# Patient Record
Sex: Female | Born: 1995 | Race: White | Hispanic: No | Marital: Single | Smoking: Never smoker
Health system: Southern US, Community
[De-identification: ages and names within clinical notes are randomized; demographics above are authoritative.]

## PROBLEM LIST (undated history)

## (undated) HISTORY — PX: ARTHROSCOPIC REPAIR ACL: SUR80

---
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
## 2017-02-13 ENCOUNTER — Ambulatory Visit (HOSPITAL_COMMUNITY)
Admission: EM | Admit: 2017-02-13 | Discharge: 2017-02-13 | Disposition: A | Discharge status: Home / Self Care | Payer: BLUE CROSS/BLUE SHIELD | Attending: Family Medicine | Admitting: Family Medicine

## 2017-02-13 ENCOUNTER — Ambulatory Visit (INDEPENDENT_AMBULATORY_CARE_PROVIDER_SITE_OTHER): Payer: BLUE CROSS/BLUE SHIELD

## 2017-02-13 ENCOUNTER — Encounter (HOSPITAL_COMMUNITY): Payer: Self-pay | Admitting: Emergency Medicine

## 2017-02-13 DIAGNOSIS — M25571 Pain in right ankle and joints of right foot: Secondary | ICD-10-CM

## 2017-02-13 NOTE — ED Triage Notes (Signed)
Yesterday jumped over a ditch and did not clear the other side.  Reports ankle rolled, heard/felt pop.  Swelling visible.  Able to move toes.  Right pedal pulses 2 plus.  There is a bruise above ankle -patient says this is an old bruise

## 2017-02-13 NOTE — Discharge Instructions (Signed)
Rest, ice, elevation and wear the mobilization device for at least 3 days and while having to be walking or prolonged standing. Sometimes he may have to wear it longer than 3 days depending on how your ankle feels. Use crutches for the first 2 or 3 days to limit weightbearing read instructions on ankle sprain and after your ankle is feeling a little better he may start in the rehabilitation phase I instructions.

## 2017-02-13 NOTE — ED Provider Notes (Signed)
CSN: [id number]     Arrival date & time 02/13/17  1002 History   First MD Initiated Contact with Patient 02/13/17 1051     Chief Complaint  Patient presents with  . Ankle Pain   (Consider location/radiation/quality/duration/timing/severity/associated sxs/prior Treatment) 21 year old female was running and attempted to jump a ditch however on landing she twisted her right ankle. She states she felt a pop in experience pain to the lateral aspect.      History reviewed. No pertinent past medical history. Past Surgical History:  Procedure Laterality Date  . ARTHROSCOPIC REPAIR ACL     No family history on file. Social History  Substance Use Topics  . Smoking status: Never Smoker  . Smokeless tobacco: Not on file  . Alcohol use No   OB History    No data available     Review of Systems  Constitutional: Negative.  Negative for activity change, chills and fever.  HENT: Negative.   Respiratory: Negative.   Musculoskeletal:       As per HPI  Skin: Negative for color change, pallor and rash.  Neurological: Negative.   All other systems reviewed and are negative.   Allergies  Patient has no known allergies.  Home Medications   Prior to Admission medications   Medication Sig Start Date End Date Taking? Authorizing Provider  OVER THE COUNTER MEDICATION Birth control pill   Yes Historical Provider, MD   Meds Ordered and Administered this Visit  Medications - No data to display  BP 123/64 (BP Location: Left Arm)   Pulse 67   Temp 98.9 F (37.2 C) (Oral)   Resp 18   LMP 01/22/2017 (Exact Date)   SpO2 100%  No data found.   Physical Exam  Constitutional: She is oriented to person, place, and time. She appears well-developed and well-nourished. No distress.  HENT:  Head: Normocephalic and atraumatic.  Eyes: EOM are normal.  Neck: Neck supple.  Cardiovascular: Normal rate.   Pulmonary/Chest: Effort normal.  Musculoskeletal: She exhibits edema and tenderness. She  exhibits no deformity.  Right ankle with swelling and pain and tenderness to the lateral aspect. No pain or tenderness to the foot or medial aspect of the ankle. Pedal pulse 1+. Limited range of motion of the ankle. Able to wiggle toes.  Lymphadenopathy:    She has no cervical adenopathy.  Neurological: She is alert and oriented to person, place, and time. No cranial nerve deficit.  Skin: Skin is warm and dry.  Psychiatric: She has a normal mood and affect.  Nursing note and vitals reviewed.   Urgent Care Course     Procedures (including critical care time)  Labs Review Labs Reviewed - No data to display  Imaging Review Dg Ankle Complete Right  Result Date: 02/13/2017 CLINICAL DATA:  Patient states yesterday evening, going for a run, went to jump over a ditch, right ankle cracked and rolled at the same time. Patient pointed to the right ankle, lateral malleolus, posterior to the lateral malleolus, posteriorly aspect of the Achilles when attempting to bear weight, to which the patient can't bear weight. Patient is unable to flex or extend the right foot now. No prior injury to the right ankle. EXAM: RIGHT ANKLE - COMPLETE 3+ VIEW COMPARISON:  None. FINDINGS: Three views of the right ankle submitted. No acute fracture or subluxation. Ankle mortise is preserved. Significant soft tissue swelling adjacent to lateral malleolus. There is well corticated bony fragment adjacent to the tip of distal fibula. This is  most likely due to prior injury measure about 6 mm. No definite donor site is identified. IMPRESSION: No acute fracture or subluxation. Ankle mortise is preserved. Significant soft tissue swelling adjacent to lateral malleolus. There is well corticated bony fragment adjacent to the tip of distal fibula. This is most likely due to prior injury measure about 6 mm. Electronically Signed   By: Natasha Mead M.D.   On: 02/13/2017 11:23     Visual Acuity Review  Right Eye Distance:   Left Eye  Distance:   Bilateral Distance:    Right Eye Near:   Left Eye Near:    Bilateral Near:         MDM   1. Acute right ankle pain    Rest, ice, elevation and wear the mobilization device for at least 3 days and while having to be walking or prolonged standing. Sometimes he may have to wear it longer than 3 days depending on how your ankle feels. Use crutches for the first 2 or 3 days to limit weightbearing read instructions on ankle sprain and after your ankle is feeling a little better he may start in the rehabilitation phase I instructions.     Hayden Rasmussen, NP 02/13/17 1149

## 2018-03-13 IMAGING — DX DG ANKLE COMPLETE 3+V*R*
3 series · 3 of 3 positions shown · non-contrast
Comparison: None.

CLINICAL DATA: Patient states yesterday evening, going for a run,
went to jump over a ditch, right ankle cracked and rolled at the
same time. Patient pointed to the right ankle, lateral malleolus,
posterior to the lateral malleolus, posteriorly aspect of the
Achilles when attempting to bear weight, to which the patient can't
bear weight. Patient is unable to flex or extend the right foot now.
No prior injury to the right ankle.

EXAM:
RIGHT ANKLE - COMPLETE 3+ VIEW

[ankle ap]
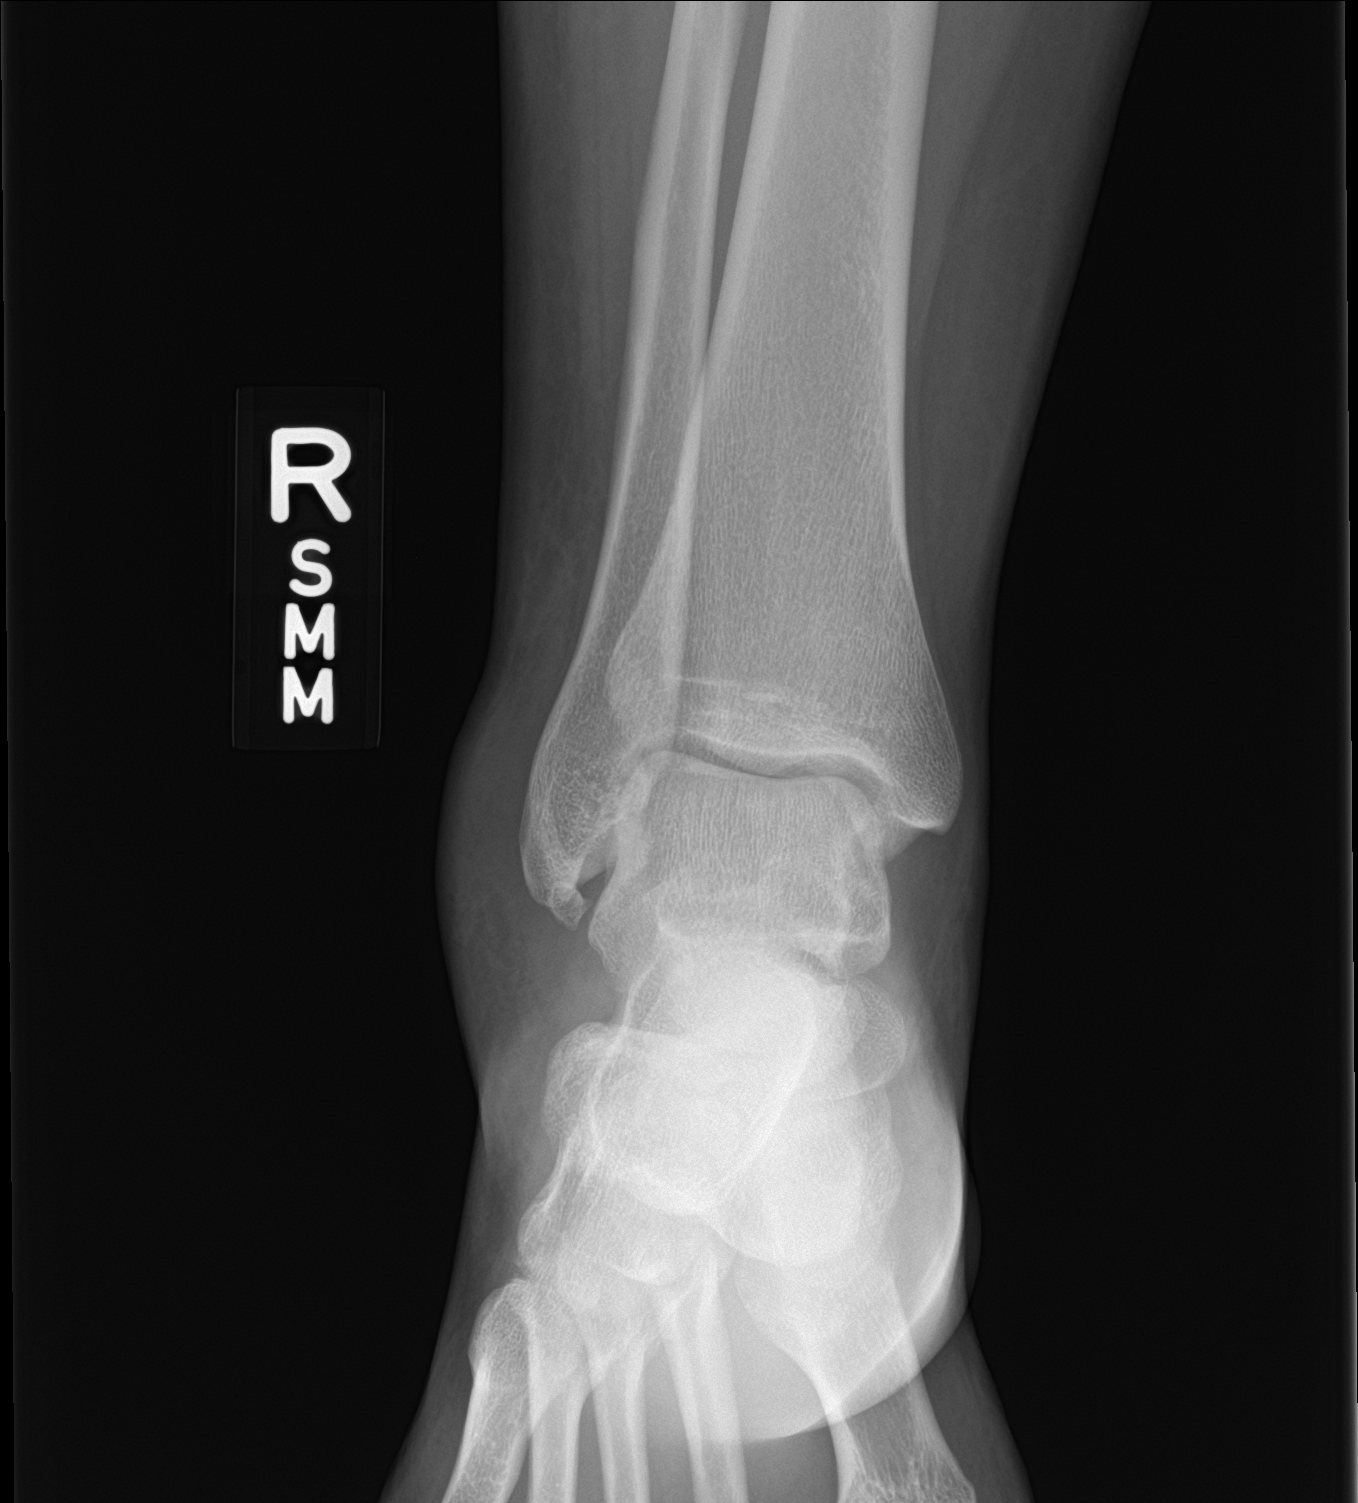

[ankle obl]
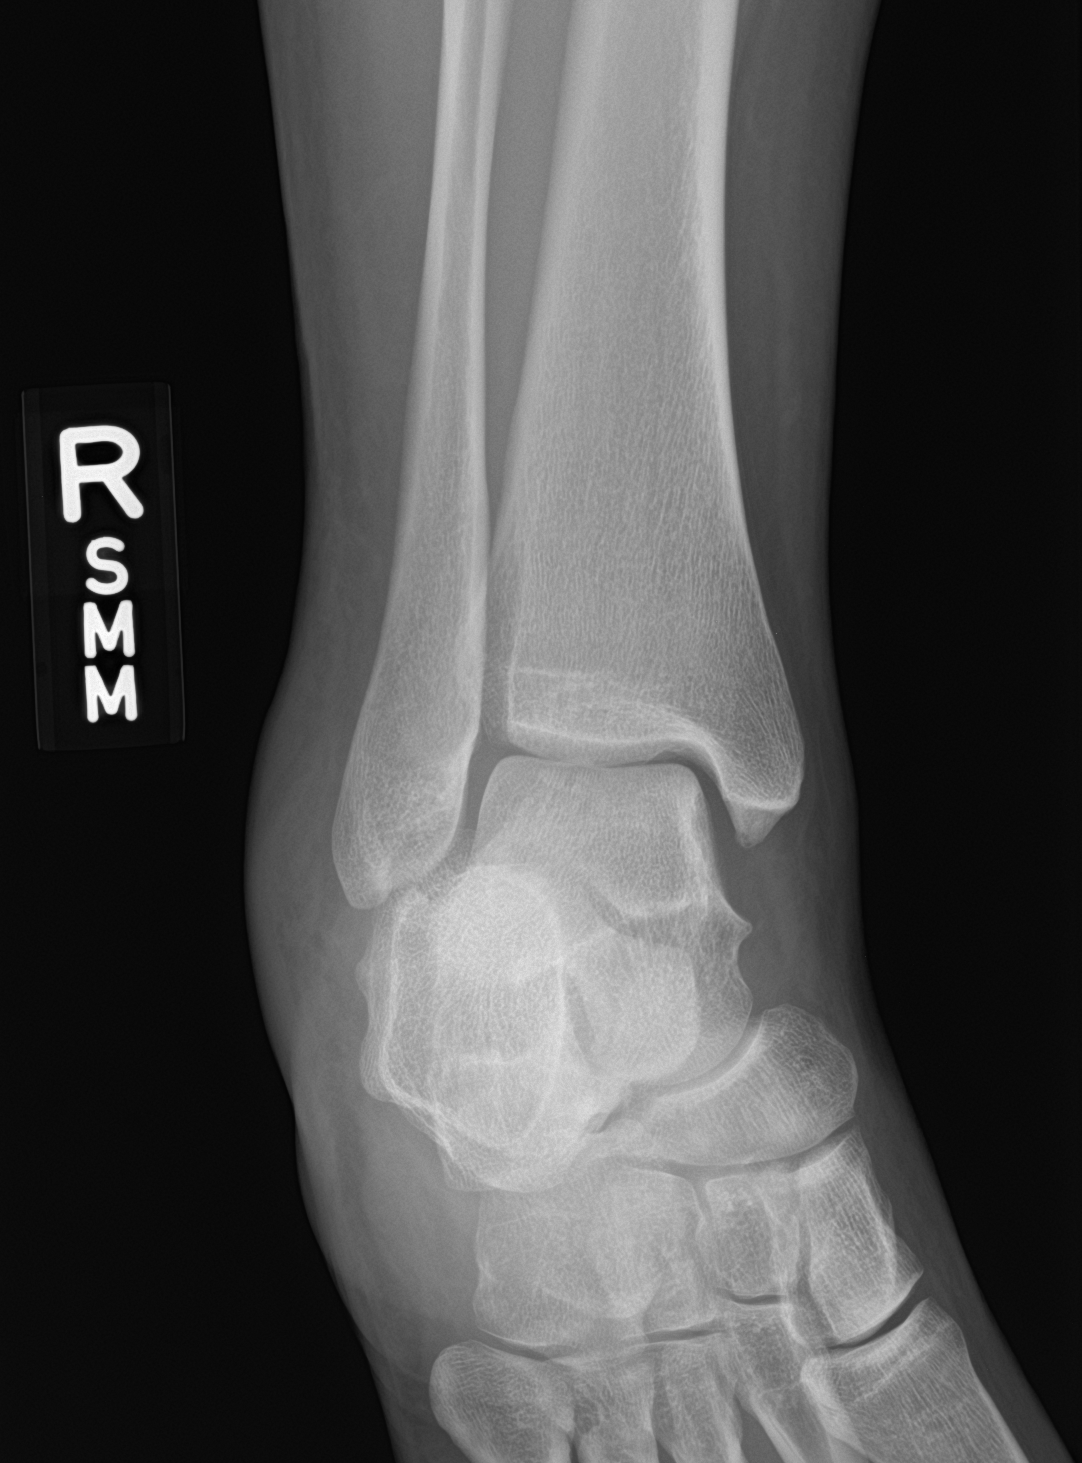

[ankle lat]
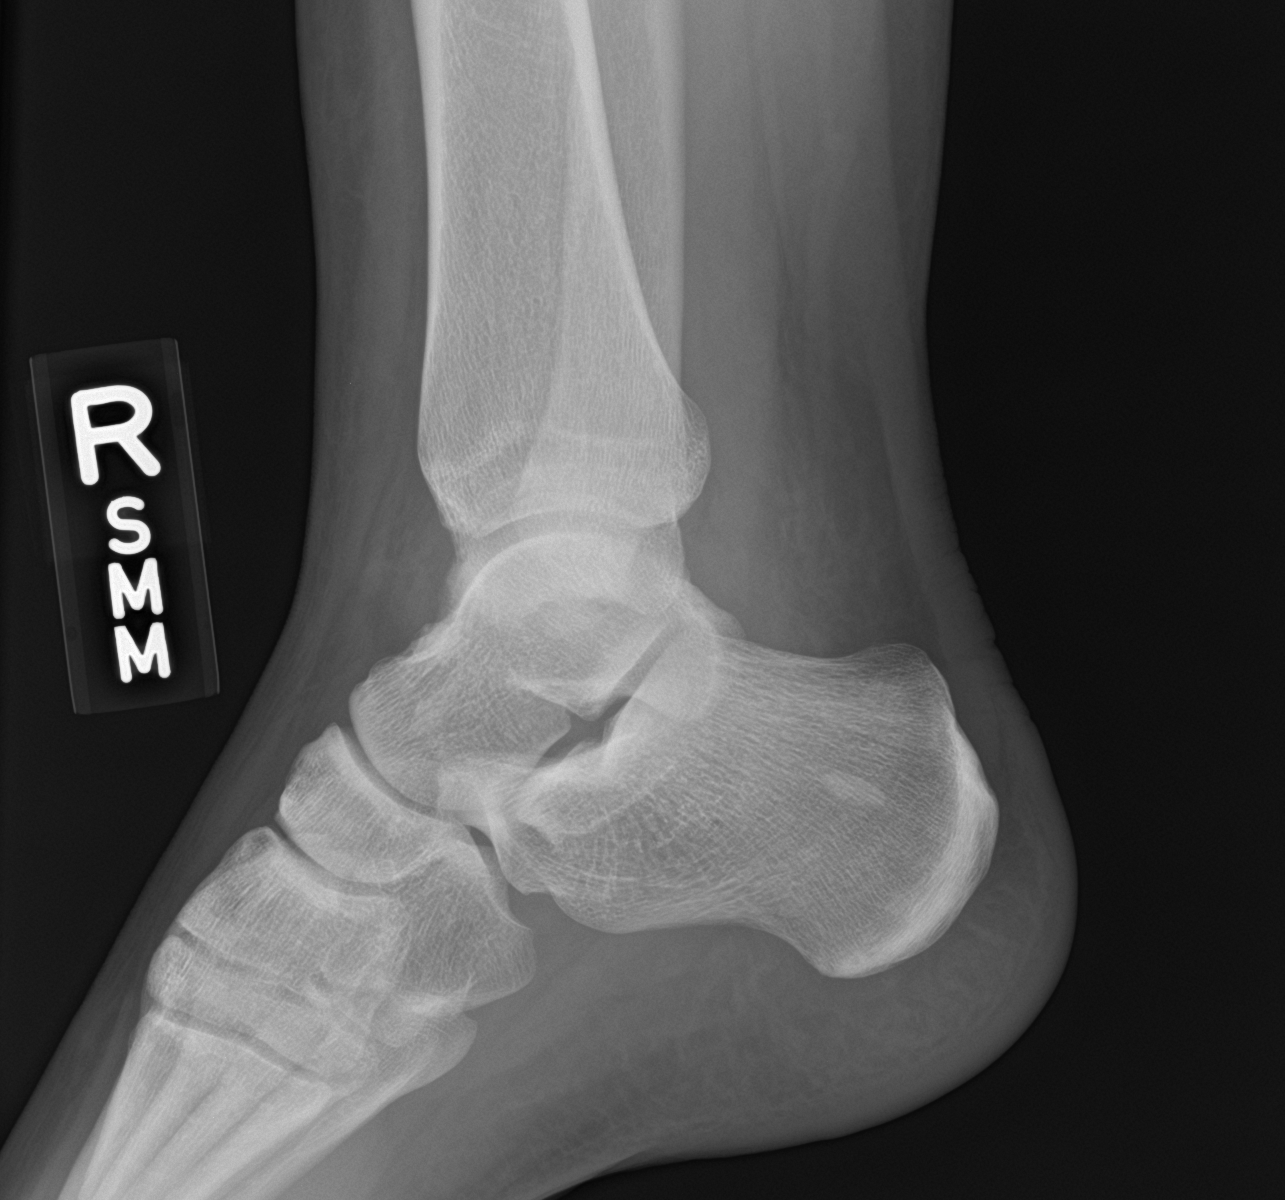

[3 of 3 positions shown; findings below may reference images not displayed]

FINDINGS: Three views of the right ankle submitted. No acute fracture or
subluxation. Ankle mortise is preserved. Significant soft tissue
swelling adjacent to lateral malleolus. There is well corticated
bony fragment adjacent to the tip of distal fibula. This is most
likely due to prior injury measure about 6 mm. No definite donor
site is identified.
IMPRESSION: No acute fracture or subluxation. Ankle mortise is preserved.
Significant soft tissue swelling adjacent to lateral malleolus.
There is well corticated bony fragment adjacent to the tip of distal
fibula. This is most likely due to prior injury measure about 6 mm.

## 2019-07-26 ENCOUNTER — Emergency Department (HOSPITAL_COMMUNITY)
Admission: EM | Admit: 2019-07-26 | Discharge: 2019-07-26 | Disposition: A | Discharge status: Home / Self Care | Payer: BLUE CROSS/BLUE SHIELD | Attending: Emergency Medicine | Admitting: Emergency Medicine

## 2019-07-26 ENCOUNTER — Other Ambulatory Visit: Payer: Self-pay

## 2019-07-26 ENCOUNTER — Encounter (HOSPITAL_COMMUNITY): Payer: Self-pay | Admitting: Emergency Medicine

## 2019-07-26 DIAGNOSIS — Z041 Encounter for examination and observation following transport accident: Secondary | ICD-10-CM | POA: Insufficient documentation

## 2019-07-26 DIAGNOSIS — M542 Cervicalgia: Secondary | ICD-10-CM | POA: Insufficient documentation

## 2019-07-26 DIAGNOSIS — M549 Dorsalgia, unspecified: Secondary | ICD-10-CM | POA: Insufficient documentation

## 2019-07-26 MED ORDER — CYCLOBENZAPRINE HCL 10 MG PO TABS: 5.0000 mg | ORAL_TABLET | Freq: Two times a day (BID) | ORAL | 0 refills | Status: AC | PRN

## 2019-07-26 MED ORDER — NAPROXEN 375 MG PO TABS: 375.0000 mg | ORAL_TABLET | Freq: Two times a day (BID) | ORAL | 0 refills | Status: AC
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
## 2019-07-26 NOTE — Discharge Instructions (Signed)
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
## 2024-09-14 ENCOUNTER — Other Ambulatory Visit: Payer: Self-pay

## 2024-09-14 ENCOUNTER — Emergency Department (HOSPITAL_BASED_OUTPATIENT_CLINIC_OR_DEPARTMENT_OTHER)
Admission: EM | Admit: 2024-09-14 | Discharge: 2024-09-14 | Disposition: A | Discharge status: Home / Self Care | Payer: Self-pay | Attending: Emergency Medicine | Admitting: Emergency Medicine

## 2024-09-14 ENCOUNTER — Emergency Department (HOSPITAL_BASED_OUTPATIENT_CLINIC_OR_DEPARTMENT_OTHER): Payer: Self-pay | Admitting: Radiology

## 2024-09-14 DIAGNOSIS — S93401A Sprain of unspecified ligament of right ankle, initial encounter: Secondary | ICD-10-CM | POA: Insufficient documentation

## 2024-09-14 DIAGNOSIS — X501XXA Overexertion from prolonged static or awkward postures, initial encounter: Secondary | ICD-10-CM | POA: Insufficient documentation

## 2024-09-14 NOTE — ED Provider Notes (Signed)
 [place] EMERGENCY DEPARTMENT AT [facility] Provider Note   CSN: [id number] Arrival date & time: 09/14/24  1026     Patient presents with: Ankle Pain   Elizabeth Erickson is a 28 y.o. female reports right ankle injury 1 week ago.  Reports that she was walking her dog and missed a step having inversion type mechanism of injury to right ankle.  Patient reports that she has previously sprained this ankle twice and she has noticed pain swelling and bruising.  She localizes her pain to posterior lateral and medial malleolus.  She has been able to ambulate with ASO ankle brace.  No fevers or chills. No injury during fall.    Ankle Pain      Prior to Admission medications   Medication Sig Start Date End Date Taking? Authorizing Provider  cyclobenzaprine  (FLEXERIL ) 10 MG tablet Take 0.5-1 tablets (5-10 mg total) by mouth 2 (two) times daily as needed for muscle spasms. 07/26/19   Harris, Abigail, PA-C  naproxen  (NAPROSYN ) 375 MG tablet Take 1 tablet (375 mg total) by mouth 2 (two) times daily. 07/26/19   Harris, Abigail, PA-C    Allergies: Patient has no known allergies.    Review of Systems  Musculoskeletal:  Positive for arthralgias.    Updated Vital Signs BP 120/60 (BP Location: Left Arm)   Pulse 77   Temp 97.9 F (36.6 C) (Oral)   Resp 18   LMP  (LMP Unknown)   SpO2 100%   Physical Exam Vitals and nursing note reviewed.  Constitutional:      General: She is not in acute distress.    Appearance: She is not toxic-appearing.  HENT:     Head: Normocephalic and atraumatic.  Eyes:     General: No scleral icterus.    Conjunctiva/sclera: Conjunctivae normal.  Cardiovascular:     Rate and Rhythm: Normal rate and regular rhythm.     Pulses: Normal pulses.     Heart sounds: Normal heart sounds.  Pulmonary:     Effort: Pulmonary effort is normal. No respiratory distress.     Breath sounds: Normal breath sounds.  Abdominal:     General: Abdomen is flat. Bowel sounds  are normal.     Palpations: Abdomen is soft.     Tenderness: There is no abdominal tenderness.  Musculoskeletal:     Right lower leg: No edema.     Left lower leg: No edema.     Comments: Right ankle with tenderness to palpation over medial and lateral posterior malleolus.  Generalized swelling over right foot and ankle with ecchymosis.  She is neurovascularly intact.  Skin:    General: Skin is warm and dry.     Findings: No lesion.  Neurological:     General: No focal deficit present.     Mental Status: She is alert and oriented to person, place, and time. Mental status is at baseline.     (all labs ordered are listed, but only abnormal results are displayed) Labs Reviewed - No data to display  EKG: None  Radiology: DG Ankle Complete Right Result Date: 09/14/2024 CLINICAL DATA:  Fall with pain and swelling. EXAM: RIGHT ANKLE - COMPLETE 3+ VIEW COMPARISON:  None Available. FINDINGS: Osseous fragments along the inferior aspects of the fibula and medial tibia appear to be well corticated. Ankle mortise is intact. Probable joint effusion. Soft tissue swelling about the ankle. IMPRESSION: Soft tissue swelling about the ankle. Probable joint effusion. No definitive fracture. Electronically Signed   By:  Newell Eke M.D.   On: 09/14/2024 11:56   DG Tibia/Fibula Right Result Date: 09/14/2024 CLINICAL DATA:  Fall, bruising, swelling and pain. EXAM: RIGHT TIBIA AND FIBULA - 2 VIEW COMPARISON:  None Available. FINDINGS: Tibia and fibula appear intact. Osseous fragments along the distal fibula appears well corticated. IMPRESSION: No acute osseous abnormality. Electronically Signed   By: Newell Eke M.D.   On: 09/14/2024 11:55     Procedures   Medications Ordered in the ED - No data to display                                  Medical Decision Making Amount and/or Complexity of Data Reviewed Radiology: ordered.   This patient presents to the ED for concern of ankle pain, this  involves an extensive number of treatment options, and is a complaint that carries with it a high risk of complications and morbidity.  The differential diagnosis includes UTI, cellulitis, sprain, septic joint, fracture   Imaging Studies ordered:  I ordered imaging studies including right ankle and tib-fib I independently visualized and interpreted imaging which showed soft tissue swelling no definitive fracture I agree with the radiologist interpretation   Cardiac Monitoring: / EKG:  The patient was maintained on a cardiac monitor.     Problem List / ED Course / Critical interventions / Medication management  Patient reporting to emergency room with inversion type ankle sprain.  She has had right ankle pain for approximately 1 week.  She has bruising and swelling to this area.  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  I did obtain an x-ray which shows no acute definitive fracture.  She is neurovascularly intact and hemodynamically stable.  Given significance of symptoms send boot while she is at work and on her feet for extended period of time, she does not think she will be able to work with crutches.  Otherwise she will continue with rest, ice elevation and ankle brace. I have reviewed the patients home medicines and have made adjustments as needed. Given emergent Ortho follow-up.  Stable for discharge with outpatient follow-up.         Final diagnoses:  Sprain of right ankle, unspecified ligament, initial encounter    ED Discharge Orders     None          Shermon Warren SAILOR, PA-C 09/14/24 1247    Jerrol Agent, MD 09/14/24 1721

## 2024-09-14 NOTE — Discharge Instructions (Signed)
 I would recommend taking anti-inflammatories, use ice heat ,compression, elevation, rest. When you are on your feet for extended period of time use the boot, otherwise try to ambulate with ankle brace.  Follow-up with [facility] Orth[facility] or primary care and return to ER with new or worsening symptoms.

## 2024-09-14 NOTE — ED Triage Notes (Addendum)
 Pt POV with c/o of tripping and falling over steps last Monday. Has previously sprained same ankle before. Bruising and swelling noted.
